# Patient Record
Sex: Female | Born: 1990 | Race: Black or African American | Hispanic: No | Marital: Single | Smoking: Current some day smoker
Health system: Southern US, Community
[De-identification: ages and names within clinical notes are randomized; demographics above are authoritative.]

## PROBLEM LIST (undated history)

## (undated) DIAGNOSIS — J45909 Unspecified asthma, uncomplicated: Secondary | ICD-10-CM

## (undated) HISTORY — PX: TONSILLECTOMY: SUR1361

---

## 2012-11-26 ENCOUNTER — Emergency Department (HOSPITAL_COMMUNITY)
Admission: EM | Admit: 2012-11-26 | Discharge: 2012-11-26 | Disposition: A | Discharge status: Home / Self Care | Payer: No Typology Code available for payment source | Attending: Emergency Medicine | Admitting: Emergency Medicine

## 2012-11-26 ENCOUNTER — Encounter (HOSPITAL_COMMUNITY): Payer: Self-pay | Admitting: Emergency Medicine

## 2012-11-26 DIAGNOSIS — Y92410 Unspecified street and highway as the place of occurrence of the external cause: Secondary | ICD-10-CM | POA: Insufficient documentation

## 2012-11-26 DIAGNOSIS — J45909 Unspecified asthma, uncomplicated: Secondary | ICD-10-CM | POA: Insufficient documentation

## 2012-11-26 DIAGNOSIS — S0993XA Unspecified injury of face, initial encounter: Secondary | ICD-10-CM | POA: Insufficient documentation

## 2012-11-26 DIAGNOSIS — S0990XA Unspecified injury of head, initial encounter: Secondary | ICD-10-CM | POA: Insufficient documentation

## 2012-11-26 DIAGNOSIS — F17200 Nicotine dependence, unspecified, uncomplicated: Secondary | ICD-10-CM | POA: Insufficient documentation

## 2012-11-26 DIAGNOSIS — Y9389 Activity, other specified: Secondary | ICD-10-CM | POA: Insufficient documentation

## 2012-11-26 HISTORY — DX: Unspecified asthma, uncomplicated: J45.909

## 2012-11-26 MED ORDER — HYDROCODONE-ACETAMINOPHEN 5-325 MG PO TABS: 2.0000 | ORAL_TABLET | ORAL | Status: DC | PRN

## 2012-11-26 MED ORDER — IBUPROFEN 400 MG PO TABS: 400.0000 mg | ORAL_TABLET | Freq: Four times a day (QID) | ORAL | Status: DC | PRN

## 2012-11-26 MED ORDER — IBUPROFEN 200 MG PO TABS
400.0000 mg | ORAL_TABLET | Freq: Once | ORAL | Status: AC
  Administered 2012-11-26: 400 mg via ORAL
  Filled 2012-11-26: qty 2

## 2012-11-26 MED ORDER — CYCLOBENZAPRINE HCL 10 MG PO TABS: 10.0000 mg | ORAL_TABLET | Freq: Two times a day (BID) | ORAL | Status: DC | PRN
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
## 2012-11-27 NOTE — ED Provider Notes (Signed)
Medical screening examination/treatment/procedure(s) were performed by non-physician practitioner and as supervising physician I was immediately available for consultation/collaboration.    Berlin Mokry R Myldred Raju, MD 11/27/12 0009 

## 2013-10-03 ENCOUNTER — Emergency Department (HOSPITAL_COMMUNITY): Payer: PRIVATE HEALTH INSURANCE

## 2013-10-03 ENCOUNTER — Emergency Department (HOSPITAL_COMMUNITY)
Admission: EM | Admit: 2013-10-03 | Discharge: 2013-10-03 | Disposition: A | Discharge status: Home / Self Care | Payer: PRIVATE HEALTH INSURANCE | Attending: Emergency Medicine | Admitting: Emergency Medicine

## 2013-10-03 ENCOUNTER — Encounter (HOSPITAL_COMMUNITY): Payer: Self-pay | Admitting: Emergency Medicine

## 2013-10-03 DIAGNOSIS — Y9389 Activity, other specified: Secondary | ICD-10-CM | POA: Diagnosis not present

## 2013-10-03 DIAGNOSIS — IMO0002 Reserved for concepts with insufficient information to code with codable children: Secondary | ICD-10-CM | POA: Insufficient documentation

## 2013-10-03 DIAGNOSIS — F17200 Nicotine dependence, unspecified, uncomplicated: Secondary | ICD-10-CM | POA: Diagnosis not present

## 2013-10-03 DIAGNOSIS — S62301A Unspecified fracture of second metacarpal bone, left hand, initial encounter for closed fracture: Secondary | ICD-10-CM

## 2013-10-03 DIAGNOSIS — S6990XA Unspecified injury of unspecified wrist, hand and finger(s), initial encounter: Secondary | ICD-10-CM | POA: Diagnosis present

## 2013-10-03 DIAGNOSIS — Y9289 Other specified places as the place of occurrence of the external cause: Secondary | ICD-10-CM | POA: Diagnosis not present

## 2013-10-03 DIAGNOSIS — S62319A Displaced fracture of base of unspecified metacarpal bone, initial encounter for closed fracture: Secondary | ICD-10-CM | POA: Insufficient documentation

## 2013-10-03 DIAGNOSIS — J45909 Unspecified asthma, uncomplicated: Secondary | ICD-10-CM | POA: Insufficient documentation

## 2013-10-03 MED ORDER — HYDROCODONE-ACETAMINOPHEN 5-325 MG PO TABS: 1.0000 | ORAL_TABLET | ORAL | Status: AC | PRN
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
## 2013-10-03 NOTE — Discharge Instructions (Signed)
Take Vicodin as needed for pain. Rest, ice and elevate your hand. Refer to attached documents for more information. Follow up with Dr. Mina Marble for further evaluation.
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
## 2013-10-03 NOTE — ED Notes (Signed)
Ortho Tech notified of order for wrist splint.

## 2013-10-07 NOTE — ED Provider Notes (Signed)
Medical screening examination/treatment/procedure(s) were performed by non-physician practitioner and as supervising physician I was immediately available for consultation/collaboration.   EKG Interpretation None       Arby Barrette, MD 10/07/13 [phone]

## 2015-09-24 IMAGING — CR DG HAND COMPLETE 3+V*L*
3 series · 3 of 3 positions shown · non-contrast
Comparison: None.

CLINICAL DATA: Left hand pain in the region of the second and third
MCP joints following an injury 2 days ago.

EXAM:
LEFT HAND - COMPLETE 3+ VIEW

[x hand pa left]
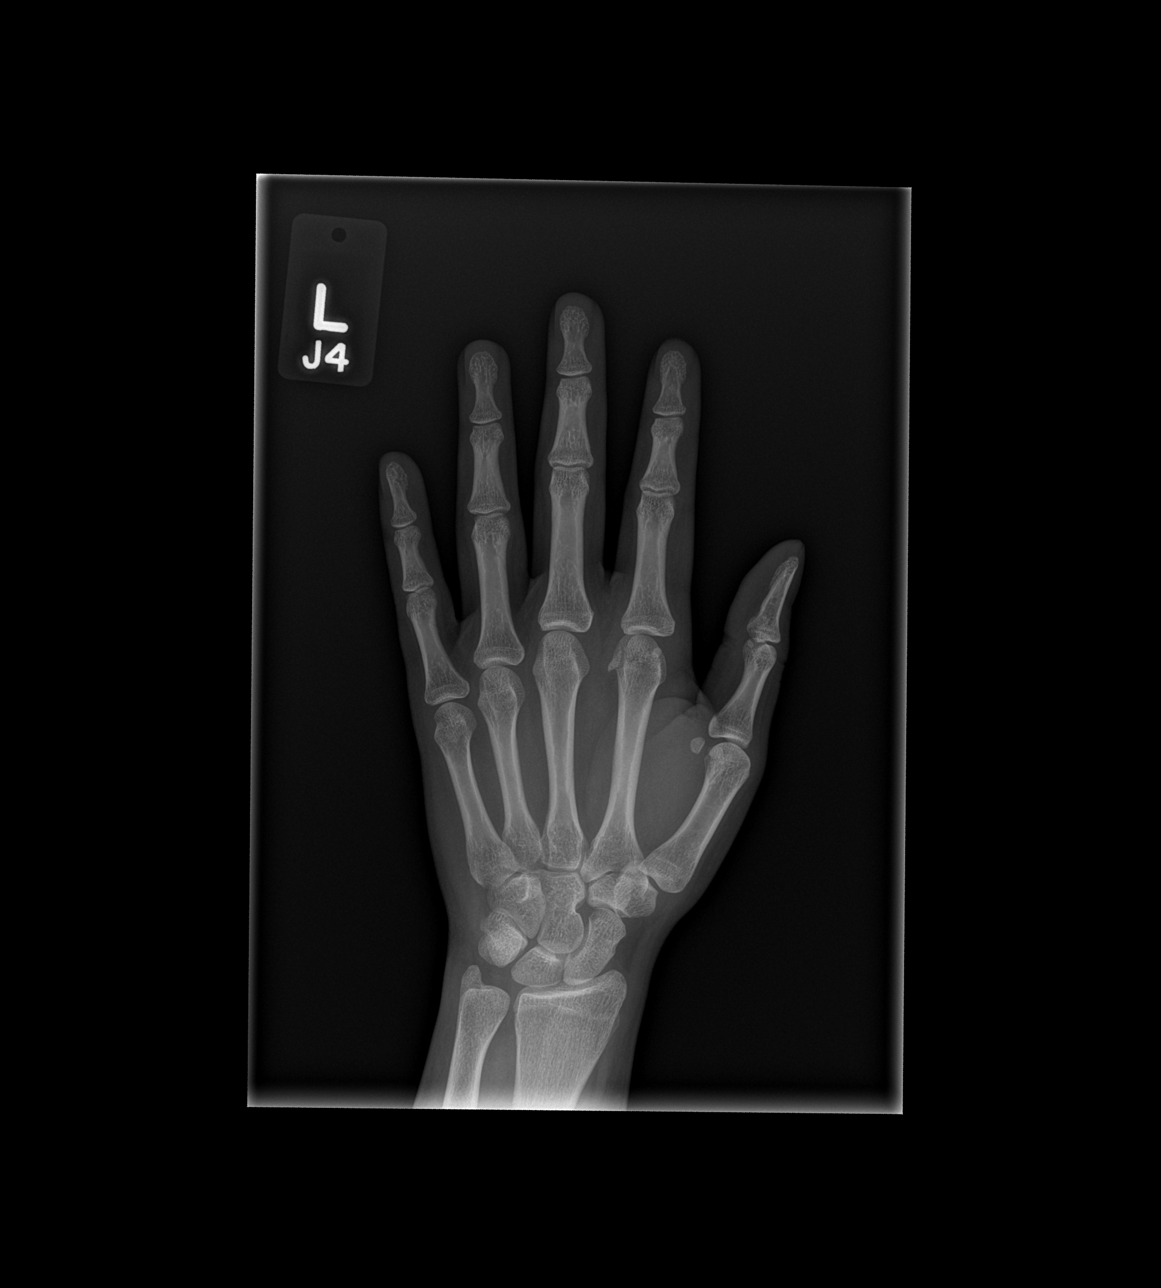

[x hand obl left]
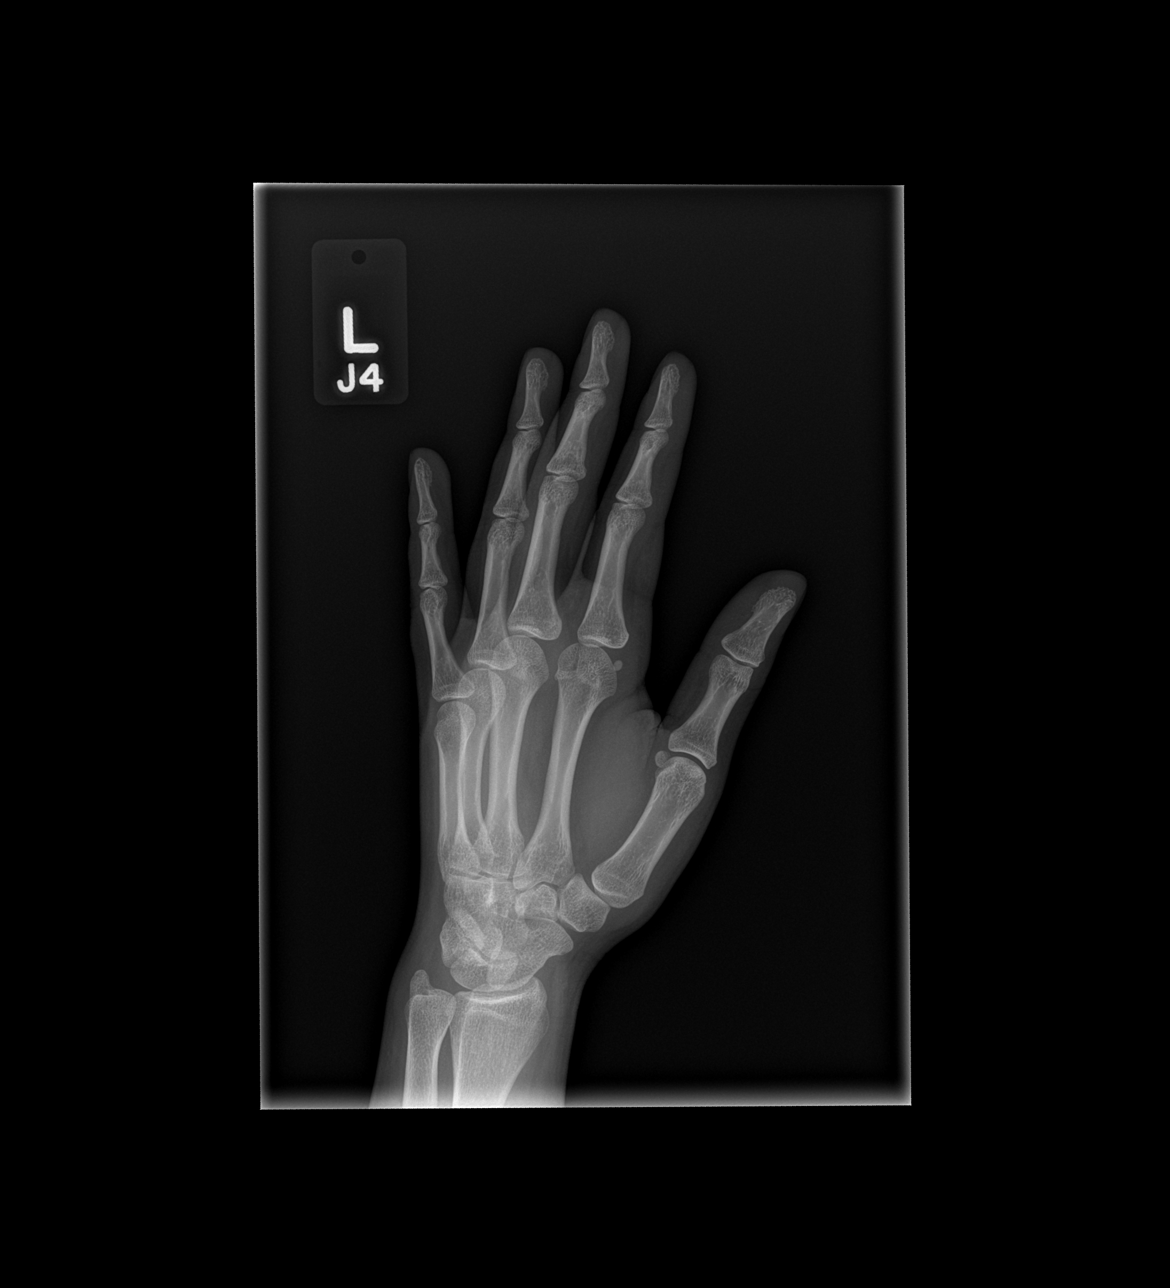

[x hand lat left]
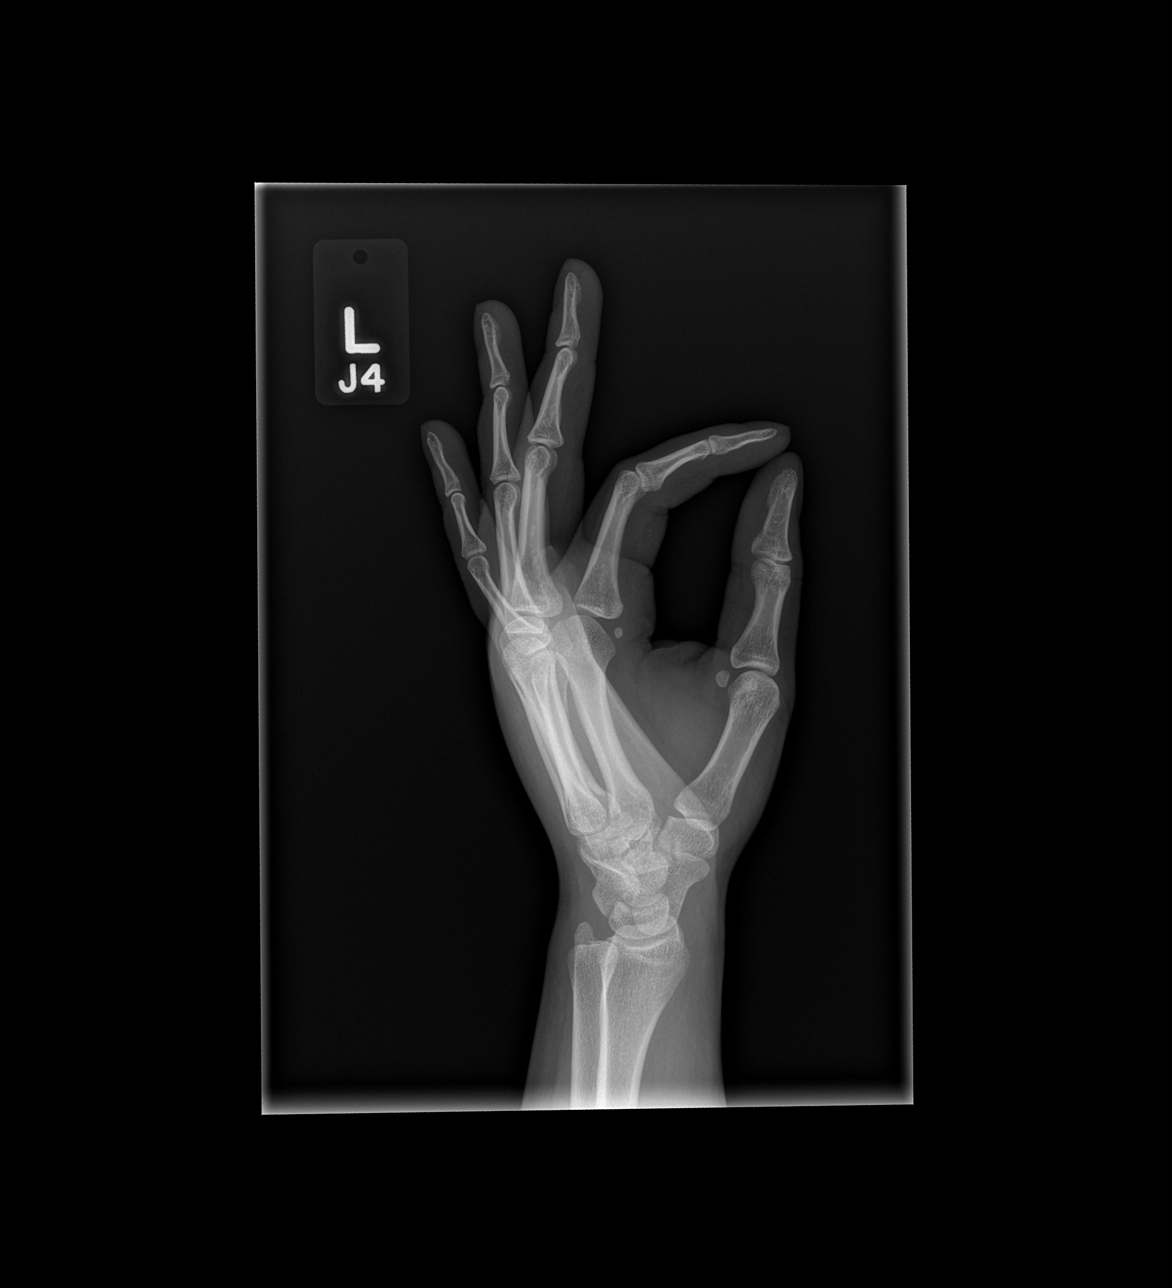

[3 of 3 positions shown; findings below may reference images not displayed]

FINDINGS: Longitudinal fracture in the ulnar aspect of the second metacarpal
head, extending into the second MCP joint. There is 1 mm of distal
displacement of the ulnar fragment as well as ulnar displacement of
an ulnar fragment. This may be comminuted.
IMPRESSION: Second metacarpal head fracture with intra-articular extension, as
described above.
